# Patient Record
Sex: Male | Born: 1985 | Race: White | Hispanic: No | Marital: Single | Smoking: Never smoker
Health system: Southern US, Community
[De-identification: ages and names within clinical notes are randomized; demographics above are authoritative.]

## PROBLEM LIST (undated history)

## (undated) DIAGNOSIS — Z940 Kidney transplant status: Secondary | ICD-10-CM

## (undated) DIAGNOSIS — I10 Essential (primary) hypertension: Secondary | ICD-10-CM

## (undated) DIAGNOSIS — N19 Unspecified kidney failure: Secondary | ICD-10-CM

## (undated) HISTORY — PX: KIDNEY TRANSPLANT: SHX239

---

## 2015-10-31 ENCOUNTER — Emergency Department (HOSPITAL_BASED_OUTPATIENT_CLINIC_OR_DEPARTMENT_OTHER): Payer: Managed Care, Other (non HMO)

## 2015-10-31 ENCOUNTER — Encounter (HOSPITAL_BASED_OUTPATIENT_CLINIC_OR_DEPARTMENT_OTHER): Payer: Self-pay | Admitting: *Deleted

## 2015-10-31 ENCOUNTER — Emergency Department (HOSPITAL_BASED_OUTPATIENT_CLINIC_OR_DEPARTMENT_OTHER)
Admission: EM | Admit: 2015-10-31 | Discharge: 2015-11-01 | Disposition: A | Discharge status: Other Acute Inpatient Hospital | Payer: Managed Care, Other (non HMO) | Attending: Emergency Medicine | Admitting: Emergency Medicine

## 2015-10-31 DIAGNOSIS — Y658 Other specified misadventures during surgical and medical care: Secondary | ICD-10-CM | POA: Insufficient documentation

## 2015-10-31 DIAGNOSIS — R1031 Right lower quadrant pain: Secondary | ICD-10-CM | POA: Diagnosis not present

## 2015-10-31 DIAGNOSIS — R109 Unspecified abdominal pain: Secondary | ICD-10-CM | POA: Diagnosis present

## 2015-10-31 DIAGNOSIS — Z79899 Other long term (current) drug therapy: Secondary | ICD-10-CM | POA: Insufficient documentation

## 2015-10-31 DIAGNOSIS — R10A1 Flank pain, right side: Secondary | ICD-10-CM

## 2015-10-31 DIAGNOSIS — I10 Essential (primary) hypertension: Secondary | ICD-10-CM | POA: Insufficient documentation

## 2015-10-31 DIAGNOSIS — R10A0 Flank pain, unspecified side: Secondary | ICD-10-CM

## 2015-10-31 DIAGNOSIS — Z87448 Personal history of other diseases of urinary system: Secondary | ICD-10-CM | POA: Insufficient documentation

## 2015-10-31 DIAGNOSIS — T8611 Kidney transplant rejection: Secondary | ICD-10-CM

## 2015-10-31 DIAGNOSIS — R110 Nausea: Secondary | ICD-10-CM | POA: Insufficient documentation

## 2015-10-31 HISTORY — DX: Kidney transplant status: Z94.0

## 2015-10-31 HISTORY — DX: Unspecified kidney failure: N19

## 2015-10-31 HISTORY — DX: Essential (primary) hypertension: I10

## 2015-10-31 LAB — COMPREHENSIVE METABOLIC PANEL
ALT: 13 U/L — AB (ref 17–63)
ANION GAP: 8 (ref 5–15)
AST: 18 U/L (ref 15–41)
Albumin: 4.1 g/dL (ref 3.5–5.0)
Alkaline Phosphatase: 74 U/L (ref 38–126)
BUN: 34 mg/dL — ABNORMAL HIGH (ref 6–20)
CHLORIDE: 113 mmol/L — AB (ref 101–111)
CO2: 19 mmol/L — AB (ref 22–32)
CREATININE: 2.33 mg/dL — AB (ref 0.61–1.24)
Calcium: 9.2 mg/dL (ref 8.9–10.3)
GFR, EST AFRICAN AMERICAN: 42 mL/min — AB (ref >60)
GFR, EST NON AFRICAN AMERICAN: 37 mL/min — AB (ref >60)
Glucose, Bld: 111 mg/dL — ABNORMAL HIGH (ref 65–99)
POTASSIUM: 4.7 mmol/L (ref 3.5–5.1)
SODIUM: 140 mmol/L (ref 135–145)
Total Bilirubin: 0.5 mg/dL (ref 0.3–1.2)
Total Protein: 7.6 g/dL (ref 6.5–8.1)

## 2015-10-31 LAB — URINE MICROSCOPIC-ADD ON: WBC, UA: NONE SEEN WBC/hpf (ref 0–5)

## 2015-10-31 LAB — CBC WITH DIFFERENTIAL/PLATELET
Basophils Absolute: 0 10*3/uL (ref 0.0–0.1)
Basophils Relative: 0 %
Eosinophils Absolute: 0.2 10*3/uL (ref 0.0–0.7)
Eosinophils Relative: 1 %
HEMATOCRIT: 39.8 % (ref 39.0–52.0)
HEMOGLOBIN: 12.8 g/dL — AB (ref 13.0–17.0)
LYMPHS ABS: 1.2 10*3/uL (ref 0.7–4.0)
LYMPHS PCT: 9 %
MCH: 29 pg (ref 26.0–34.0)
MCHC: 32.2 g/dL (ref 30.0–36.0)
MCV: 90.2 fL (ref 78.0–100.0)
MONOS PCT: 6 %
Monocytes Absolute: 0.8 10*3/uL (ref 0.1–1.0)
NEUTROS ABS: 11.3 10*3/uL — AB (ref 1.7–7.7)
NEUTROS PCT: 84 %
Platelets: 246 10*3/uL (ref 150–400)
RBC: 4.41 MIL/uL (ref 4.22–5.81)
RDW: 13.1 % (ref 11.5–15.5)
WBC: 13.6 10*3/uL — AB (ref 4.0–10.5)

## 2015-10-31 LAB — URINALYSIS, ROUTINE W REFLEX MICROSCOPIC
Bilirubin Urine: NEGATIVE
GLUCOSE, UA: NEGATIVE mg/dL
KETONES UR: NEGATIVE mg/dL
Leukocytes, UA: NEGATIVE
Nitrite: NEGATIVE
PROTEIN: 30 mg/dL — AB
Specific Gravity, Urine: 1.015 (ref 1.005–1.030)
pH: 6 (ref 5.0–8.0)

## 2015-10-31 MED ORDER — SODIUM CHLORIDE 0.9 % IV BOLUS (SEPSIS)
1000.0000 mL | Freq: Once | INTRAVENOUS | Status: AC
  Administered 2015-10-31: 1000 mL via INTRAVENOUS

## 2015-10-31 MED ORDER — FENTANYL CITRATE (PF) 100 MCG/2ML IJ SOLN
100.0000 ug | Freq: Once | INTRAMUSCULAR | Status: AC
  Administered 2015-10-31: 100 ug via INTRAVENOUS
  Filled 2015-10-31: qty 2

## 2015-10-31 NOTE — ED Notes (Signed)
Pt c/o right flank pain x 1 day 

## 2015-10-31 NOTE — ED Notes (Signed)
Patient transported to CT 

## 2015-10-31 NOTE — ED Provider Notes (Addendum)
CSN: [id number]     Arrival date & time 10/31/15  2225 History  By signing my name below, I, Alex Randall, attest that this documentation has been prepared under the direction and in the presence of Alex Core, MD. Electronically Signed: Phillis Randall, ED Scribe. 10/31/2015. 10:45 PM.    Chief Complaint  Patient presents with  . Flank Pain   The history is provided by the patient. No language interpreter was used.  HPI Comments: Alex Randall is a 30 y.o. Male with a hx of renal failure, HTN, and kidney transplant who presents to the Emergency Department complaining of constant, gradually worsening right flank pain onset 3 days ago, worsening today. Pt reports associated nausea. He states that he is taking his daily medications as directed, and works outside so he may have been around sick contacts. He denies hx of complications with kidney transplant, fever, chills, vomiting, diarrhea, constipation, dysuria, hematuria, frequency, urgency, or rash.   Past Medical History  Diagnosis Date  . Kidney transplant recipient   . Renal failure   . Hypertension    Past Surgical History  Procedure Laterality Date  . Kidney transplant     History reviewed. No pertinent family history. Social History  Substance Use Topics  . Smoking status: Never Smoker   . Smokeless tobacco: None  . Alcohol Use: No    Review of Systems  Constitutional: Negative for fever and chills.  Gastrointestinal: Positive for nausea. Negative for vomiting, diarrhea and constipation.  Genitourinary: Positive for flank pain. Negative for dysuria, urgency, frequency and hematuria.  Skin: Negative for rash.  All other systems reviewed and are negative.  Allergies  Lisinopril  Home Medications   Prior to Admission medications   Medication Sig Start Date End Date Taking? Authorizing Provider  amLODipine (NORVASC) 5 MG tablet Take 5 mg by mouth daily.   Yes Historical Provider, MD  mycophenolate (MYFORTIC) 360  MG TBEC EC tablet Take 360 mg by mouth 2 (two) times daily.   Yes Historical Provider, MD  tacrolimus (PROGRAF) 5 MG capsule Take 5 mg by mouth 2 (two) times daily.   Yes Historical Provider, MD   BP 143/89 mmHg  Pulse 57  Temp(Src) 97.6 F (36.4 C) (Oral)  Resp 18  Ht 6' (1.829 m)  Wt 145 lb (65.772 kg)  BMI 19.66 kg/m2  SpO2 100% Physical Exam  Constitutional: He is oriented to person, place, and time. He appears well-developed and well-nourished.  HENT:  Head: Normocephalic and atraumatic.  Eyes: EOM are normal. Pupils are equal, round, and reactive to light.  Neck: Normal range of motion. Neck supple.  Cardiovascular: Normal rate, regular rhythm and normal heart sounds.  Exam reveals no gallop and no friction rub.   No murmur heard. Pulmonary/Chest: Effort normal and breath sounds normal. He has no wheezes.  Abdominal: Soft. There is no tenderness. There is no CVA tenderness.  RLQ fullness with no tenderness at the site  Musculoskeletal: Normal range of motion.  Neurological: He is alert and oriented to person, place, and time.  Skin: Skin is warm and dry. No rash noted.  Psychiatric: He has a normal mood and affect. His behavior is normal.  Nursing note and vitals reviewed.   ED Course  Procedures (including critical care time) Results for orders placed or performed during the hospital encounter of 10/31/15  Urinalysis, Routine w reflex microscopic (not at [facility])  Result Value Ref Range   Color, Urine YELLOW YELLOW   APPearance CLEAR CLEAR  Specific Gravity, Urine 1.015 1.005 - 1.030   pH 6.0 5.0 - 8.0   Glucose, UA NEGATIVE NEGATIVE mg/dL   Hgb urine dipstick SMALL (A) NEGATIVE   Bilirubin Urine NEGATIVE NEGATIVE   Ketones, ur NEGATIVE NEGATIVE mg/dL   Protein, ur 30 (A) NEGATIVE mg/dL   Nitrite NEGATIVE NEGATIVE   Leukocytes, UA NEGATIVE NEGATIVE  Comprehensive metabolic panel  Result Value Ref Range   Sodium 140 135 - 145 mmol/L   Potassium 4.7 3.5 - 5.1  mmol/L   Chloride 113 (H) 101 - 111 mmol/L   CO2 19 (L) 22 - 32 mmol/L   Glucose, Bld 111 (H) 65 - 99 mg/dL   BUN 34 (H) 6 - 20 mg/dL   Creatinine, Ser 1.61 (H) 0.61 - 1.24 mg/dL   Calcium 9.2 8.9 - 09.6 mg/dL   Total Protein 7.6 6.5 - 8.1 g/dL   Albumin 4.1 3.5 - 5.0 g/dL   AST 18 15 - 41 U/L   ALT 13 (L) 17 - 63 U/L   Alkaline Phosphatase 74 38 - 126 U/L   Total Bilirubin 0.5 0.3 - 1.2 mg/dL   GFR calc non Af Amer 37 (L) >60 mL/min   GFR calc Af Amer 42 (L) >60 mL/min   Anion gap 8 5 - 15  CBC with Differential  Result Value Ref Range   WBC 13.6 (H) 4.0 - 10.5 K/uL   RBC 4.41 4.22 - 5.81 MIL/uL   Hemoglobin 12.8 (L) 13.0 - 17.0 g/dL   HCT 04.5 40.9 - 81.1 %   MCV 90.2 78.0 - 100.0 fL   MCH 29.0 26.0 - 34.0 pg   MCHC 32.2 30.0 - 36.0 g/dL   RDW 91.4 78.2 - 95.6 %   Platelets 246 150 - 400 K/uL   Neutrophils Relative % 84 %   Neutro Abs 11.3 (H) 1.7 - 7.7 K/uL   Lymphocytes Relative 9 %   Lymphs Abs 1.2 0.7 - 4.0 K/uL   Monocytes Relative 6 %   Monocytes Absolute 0.8 0.1 - 1.0 K/uL   Eosinophils Relative 1 %   Eosinophils Absolute 0.2 0.0 - 0.7 K/uL   Basophils Relative 0 %   Basophils Absolute 0.0 0.0 - 0.1 K/uL  Urine microscopic-add on  Result Value Ref Range   Squamous Epithelial / LPF 0-5 (A) NONE SEEN   WBC, UA NONE SEEN 0 - 5 WBC/hpf   RBC / HPF 0-5 0 - 5 RBC/hpf   Bacteria, UA RARE (A) NONE SEEN   No results found.  10:44 PM-Discussed treatment plan which includes labs with pt at bedside and pt agreed to plan.   Labs Review Labs Reviewed  URINALYSIS, ROUTINE W REFLEX MICROSCOPIC (NOT AT [facility]) - Abnormal; Notable for the following:    Hgb urine dipstick SMALL (*)    Protein, ur 30 (*)    All other components within normal limits  COMPREHENSIVE METABOLIC PANEL - Abnormal; Notable for the following:    Chloride 113 (*)    CO2 19 (*)    Glucose, Bld 111 (*)    BUN 34 (*)    Creatinine, Ser 2.33 (*)    ALT 13 (*)    GFR calc non Af Amer 37 (*)    GFR  calc Af Amer 42 (*)    All other components within normal limits  CBC WITH DIFFERENTIAL/PLATELET - Abnormal; Notable for the following:    WBC 13.6 (*)    Hemoglobin 12.8 (*)    Neutro  Abs 11.3 (*)    All other components within normal limits  URINE MICROSCOPIC-ADD ON - Abnormal; Notable for the following:    Squamous Epithelial / LPF 0-5 (*)    Bacteria, UA RARE (*)    All other components within normal limits    Imaging Review No results found. I have personally reviewed and evaluated these images and lab results as part of my medical decision-making.   EKG Interpretation None      MDM   Final diagnoses:  Right flank pain    Patient presents with right flank pain. Been dull over the last 3 days. More severe now. Has nauseousness without fever. He has had a renal transplant. No dysuria. He has been taking his medications. Will get urinalysis and blood work. Overall benign exam. Care turned over to Dr Preston Fleeting I personally performed the services described in this documentation, which was scribed in my presence. The recorded information has been reviewed and is accurate.      Alex Core, MD 10/31/15 2254 Patient has had worsened kidney function. Says baseline creatinine is around 1.5 and is now 2.3. He now states he has been off his antirejection medications for a while now. Will get CT scan to evaluate the right flank pain. Patient is managed at [facility].   Alex Core, MD 10/31/15 [phone]

## 2015-11-01 DIAGNOSIS — R110 Nausea: Secondary | ICD-10-CM | POA: Diagnosis not present

## 2015-11-01 DIAGNOSIS — Z87448 Personal history of other diseases of urinary system: Secondary | ICD-10-CM | POA: Diagnosis not present

## 2015-11-01 DIAGNOSIS — Z79899 Other long term (current) drug therapy: Secondary | ICD-10-CM | POA: Diagnosis not present

## 2015-11-01 DIAGNOSIS — R109 Unspecified abdominal pain: Secondary | ICD-10-CM | POA: Diagnosis present

## 2015-11-01 DIAGNOSIS — T8611 Kidney transplant rejection: Secondary | ICD-10-CM | POA: Diagnosis not present

## 2015-11-01 DIAGNOSIS — R1031 Right lower quadrant pain: Secondary | ICD-10-CM | POA: Diagnosis not present

## 2015-11-01 DIAGNOSIS — Y658 Other specified misadventures during surgical and medical care: Secondary | ICD-10-CM | POA: Diagnosis not present

## 2015-11-01 DIAGNOSIS — I10 Essential (primary) hypertension: Secondary | ICD-10-CM | POA: Diagnosis not present

## 2015-11-01 MED ORDER — ONDANSETRON HCL 4 MG/2ML IJ SOLN
4.0000 mg | Freq: Once | INTRAMUSCULAR | Status: AC
  Administered 2015-11-01: 4 mg via INTRAVENOUS
  Filled 2015-11-01: qty 2

## 2015-11-01 MED ORDER — HYDROMORPHONE HCL 1 MG/ML IJ SOLN
1.0000 mg | Freq: Once | INTRAMUSCULAR | Status: AC
  Administered 2015-11-01: 1 mg via INTRAVENOUS
  Filled 2015-11-01: qty 1

## 2015-11-01 MED ORDER — DEXTROSE 5 % IV SOLN
1.0000 g | Freq: Once | INTRAVENOUS | Status: AC
  Administered 2015-11-01: 1 g via INTRAVENOUS
  Filled 2015-11-01: qty 10

## 2015-11-01 MED ORDER — MORPHINE SULFATE (PF) 4 MG/ML IV SOLN
4.0000 mg | Freq: Once | INTRAVENOUS | Status: AC
  Administered 2015-11-01: 4 mg via INTRAVENOUS
  Filled 2015-11-01: qty 1

## 2015-11-01 NOTE — ED Provider Notes (Signed)
Patient initially seen and evaluated by Dr. Gara Kroner transplant patient with flank pain and increasing creatinine. CT does show edematous of transplanted kidney worrisome for possible rejection. No evidence of urinary tract infection. He is cared for at [facility] Case is discussed with Dr. Marland Mcalpine of nephrology service who agrees to accept the patient in transfer. She requests he be given a dose of ceftriaxone prior to transfer and this is done.  Results for orders placed or performed during the hospital encounter of 10/31/15  Urinalysis, Routine w reflex microscopic (not at [facility])  Result Value Ref Range   Color, Urine YELLOW YELLOW   APPearance CLEAR CLEAR   Specific Gravity, Urine 1.015 1.005 - 1.030   pH 6.0 5.0 - 8.0   Glucose, UA NEGATIVE NEGATIVE mg/dL   Hgb urine dipstick SMALL (A) NEGATIVE   Bilirubin Urine NEGATIVE NEGATIVE   Ketones, ur NEGATIVE NEGATIVE mg/dL   Protein, ur 30 (A) NEGATIVE mg/dL   Nitrite NEGATIVE NEGATIVE   Leukocytes, UA NEGATIVE NEGATIVE  Comprehensive metabolic panel  Result Value Ref Range   Sodium 140 135 - 145 mmol/L   Potassium 4.7 3.5 - 5.1 mmol/L   Chloride 113 (H) 101 - 111 mmol/L   CO2 19 (L) 22 - 32 mmol/L   Glucose, Bld 111 (H) 65 - 99 mg/dL   BUN 34 (H) 6 - 20 mg/dL   Creatinine, Ser 1.61 (H) 0.61 - 1.24 mg/dL   Calcium 9.2 8.9 - 09.6 mg/dL   Total Protein 7.6 6.5 - 8.1 g/dL   Albumin 4.1 3.5 - 5.0 g/dL   AST 18 15 - 41 U/L   ALT 13 (L) 17 - 63 U/L   Alkaline Phosphatase 74 38 - 126 U/L   Total Bilirubin 0.5 0.3 - 1.2 mg/dL   GFR calc non Af Amer 37 (L) >60 mL/min   GFR calc Af Amer 42 (L) >60 mL/min   Anion gap 8 5 - 15  CBC with Differential  Result Value Ref Range   WBC 13.6 (H) 4.0 - 10.5 K/uL   RBC 4.41 4.22 - 5.81 MIL/uL   Hemoglobin 12.8 (L) 13.0 - 17.0 g/dL   HCT 04.5 40.9 - 81.1 %   MCV 90.2 78.0 - 100.0 fL   MCH 29.0 26.0 - 34.0 pg   MCHC 32.2 30.0 - 36.0 g/dL   RDW 91.4 78.2 - 95.6 %   Platelets 246  150 - 400 K/uL   Neutrophils Relative % 84 %   Neutro Abs 11.3 (H) 1.7 - 7.7 K/uL   Lymphocytes Relative 9 %   Lymphs Abs 1.2 0.7 - 4.0 K/uL   Monocytes Relative 6 %   Monocytes Absolute 0.8 0.1 - 1.0 K/uL   Eosinophils Relative 1 %   Eosinophils Absolute 0.2 0.0 - 0.7 K/uL   Basophils Relative 0 %   Basophils Absolute 0.0 0.0 - 0.1 K/uL  Urine microscopic-add on  Result Value Ref Range   Squamous Epithelial / LPF 0-5 (A) NONE SEEN   WBC, UA NONE SEEN 0 - 5 WBC/hpf   RBC / HPF 0-5 0 - 5 RBC/hpf   Bacteria, UA RARE (A) NONE SEEN   Ct Renal Stone Study  11/01/2015  CLINICAL DATA:  Constant gradually worsening RIGHT flank pain for 3 days and hematuria. History of polycystic kidney disease and renal transplant, hypertension and renal failure. Elevated creatinine. EXAM: CT ABDOMEN AND PELVIS WITHOUT CONTRAST TECHNIQUE: Multidetector CT imaging of the abdomen and pelvis was performed following  the standard protocol without IV contrast. COMPARISON:  None. FINDINGS: LUNG BASES: Included view of the lung bases are clear. The visualized heart and pericardium are unremarkable. KIDNEYS/BLADDER: Atrophic cystic native kidneys. RIGHT renal transplant with mild hydronephrosis, the transplant appears mildly enlarged and edematous. No urolithiasis. Urinary bladder is partially distended with mild wall thickening. SOLID ORGANS: The liver, spleen, gallbladder, and adrenal glands are unremarkable for this non-contrast examination. Fullness in the mid retroperitoneum, anterior to the aorta and, posterior to the pancreas. GASTROINTESTINAL TRACT: The stomach, small and large bowel are normal in course and caliber without inflammatory changes, the sensitivity may be decreased by lack of enteric contrast. Mild amount of retained large bowel stool. The appendix is not discretely identified, however there are no inflammatory changes in the right lower quadrant. PERITONEUM/RETROPERITONEUM: Aortoiliac vessels are normal in  course and caliber. No lymphadenopathy by CT size criteria. Prostate size is normal. No intraperitoneal free fluid nor free air. SOFT TISSUES/ OSSEOUS STRUCTURES: Nonsuspicious. 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 IMPRESSION: Atrophic cystic native kidneys. Enlarged edematous RIGHT pelvic renal transplant, mild hydronephrosis without urolithiasis. Mild bladder wall thickening can be seen with cystitis. Midline retroperitoneal fullness which could represent prominent vessel, distended duodenum. Given patient's history of transplant, lymphadenopathy is possible. Recommend follow-up. Electronically Signed   By: Awilda Metro M.D.   On: 11/01/2015 00:17   Images viewed by me.   Dione Booze, MD 11/01/15 [phone]

## 2017-03-26 IMAGING — CT CT RENAL STONE PROTOCOL
2 of 4 series · 15 of 46 positions shown, 17 images · non-contrast
Comparison: None.

CLINICAL DATA: Constant gradually worsening RIGHT flank pain for 3
days and hematuria. History of polycystic kidney disease and renal
transplant, hypertension and renal failure. Elevated creatinine.

EXAM:
CT ABDOMEN AND PELVIS WITHOUT CONTRAST
TECHNIQUE: Multidetector CT imaging of the abdomen and pelvis was performed
following the standard protocol without IV contrast.

[Series 2: axial st · axial · 0.72mm/px · z∈[+688,+1103]mm · 12 of 95 slices shown, 14 images]
[im 8/95  soft-tissue]
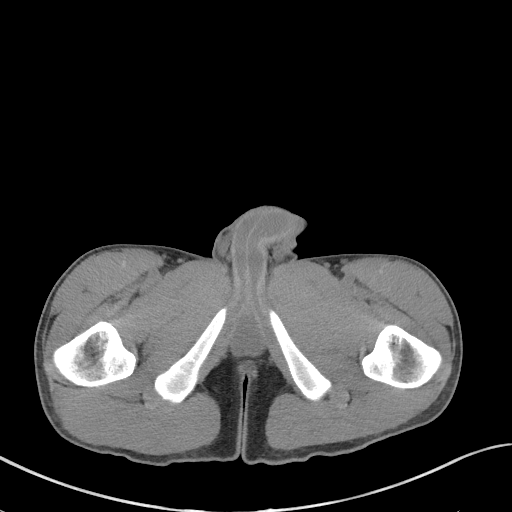
[im 8/95  bone]
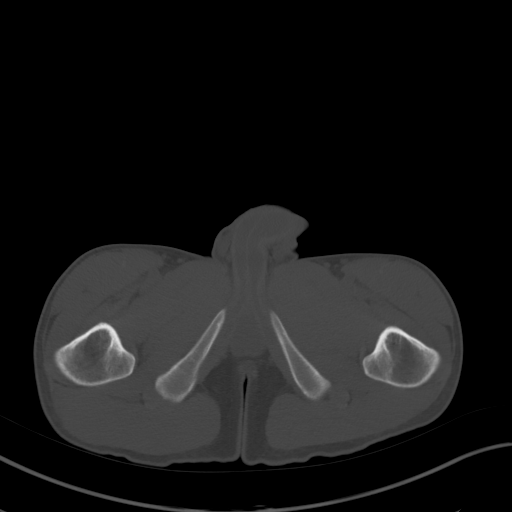
[im 16/95  soft-tissue]
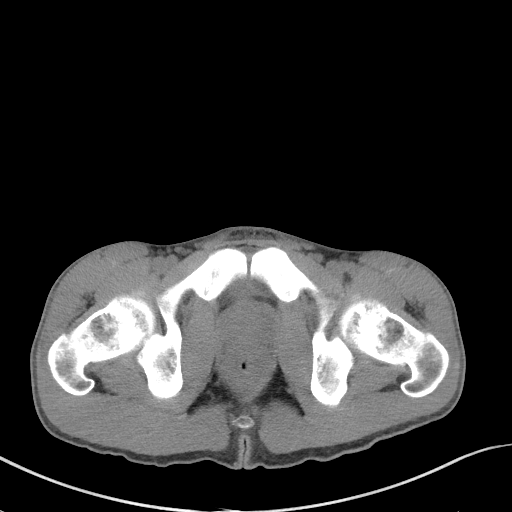
[im 23/95  soft-tissue]
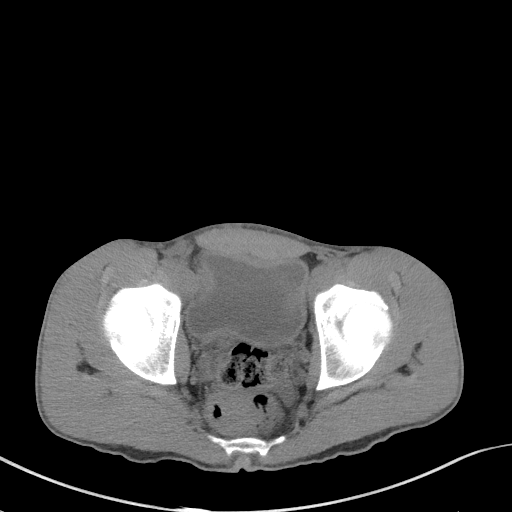
[im 31/95  soft-tissue]
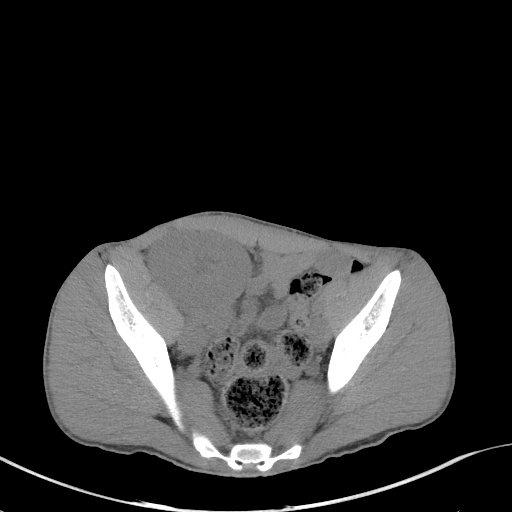
[im 38/95  soft-tissue]
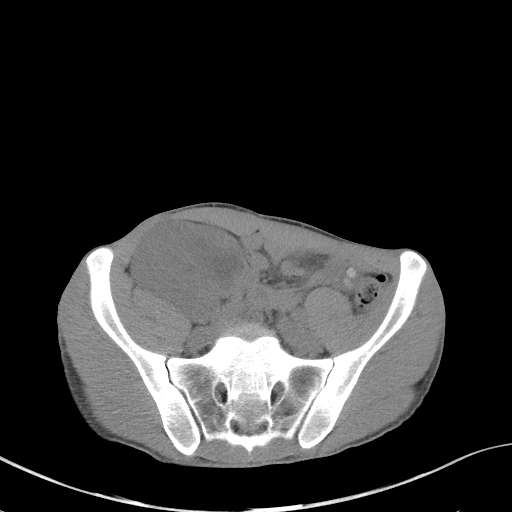
[im 46/95  soft-tissue]
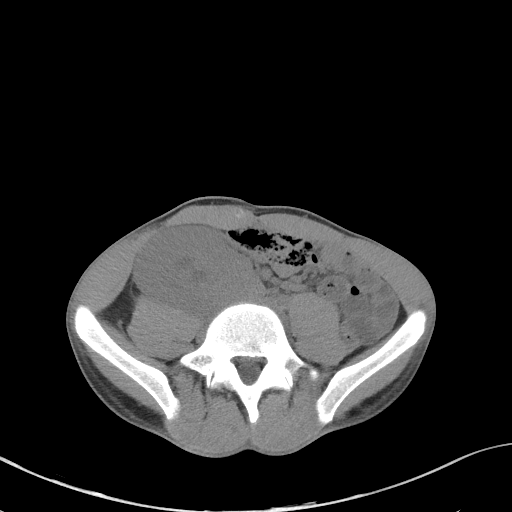
[im 53/95  soft-tissue]
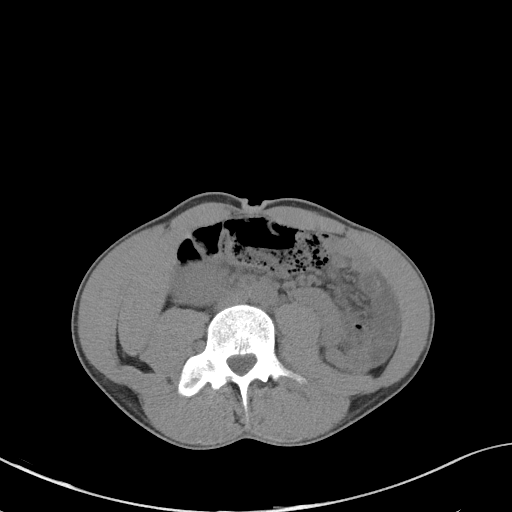
[im 61/95  soft-tissue]
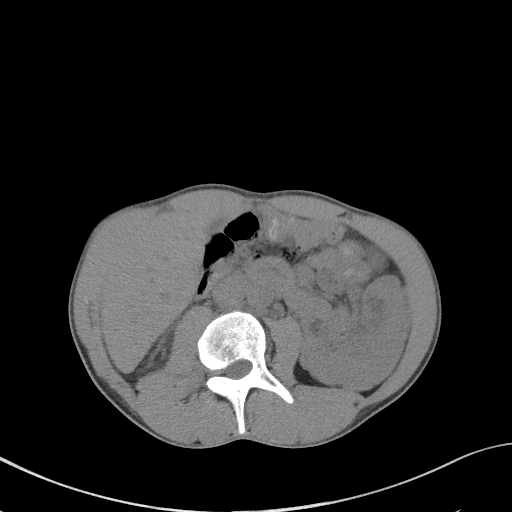
[im 68/95  soft-tissue]
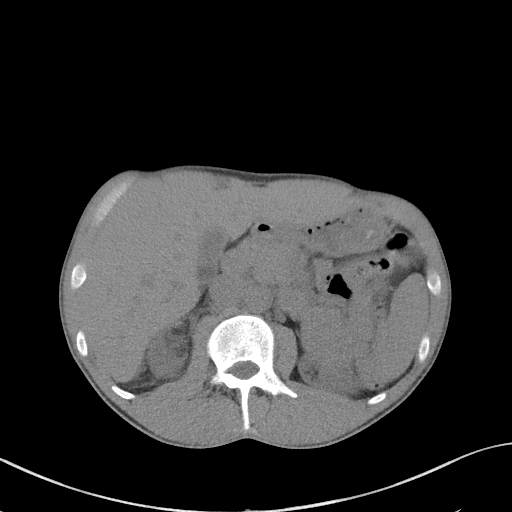
[im 68/95  bone]
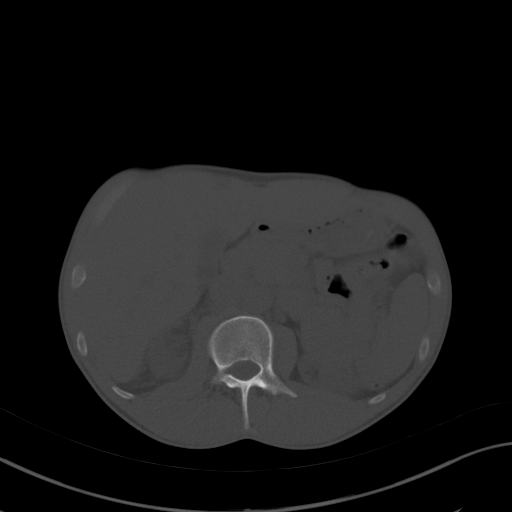
[im 76/95  soft-tissue]
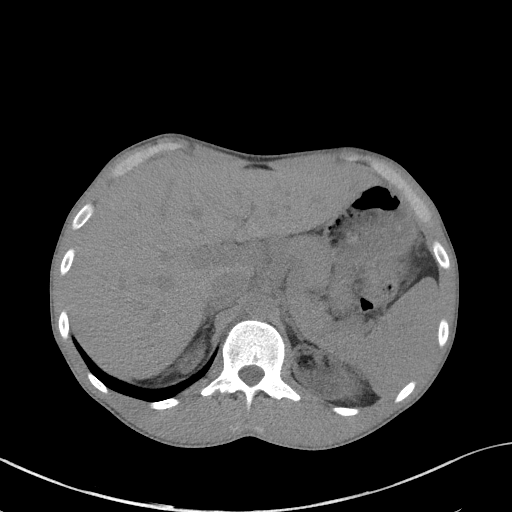
[im 83/95  soft-tissue]
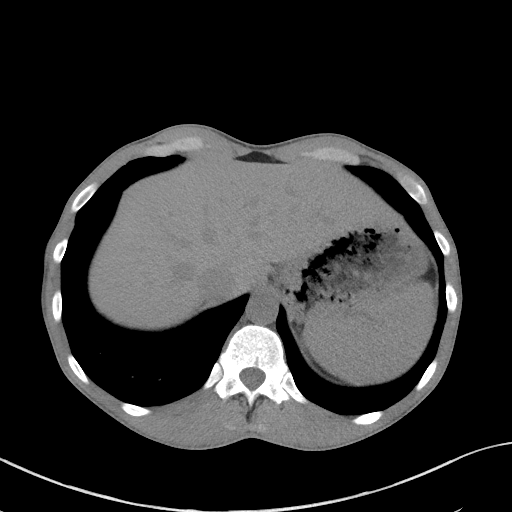
[im 91/95  soft-tissue]
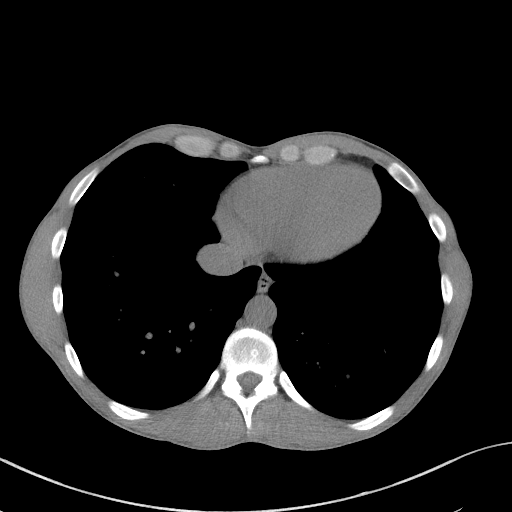

[Series 4: coronal st · coronal · 0.66mm/px · 3 of 80 slices shown]
[im 27/80  soft-tissue]
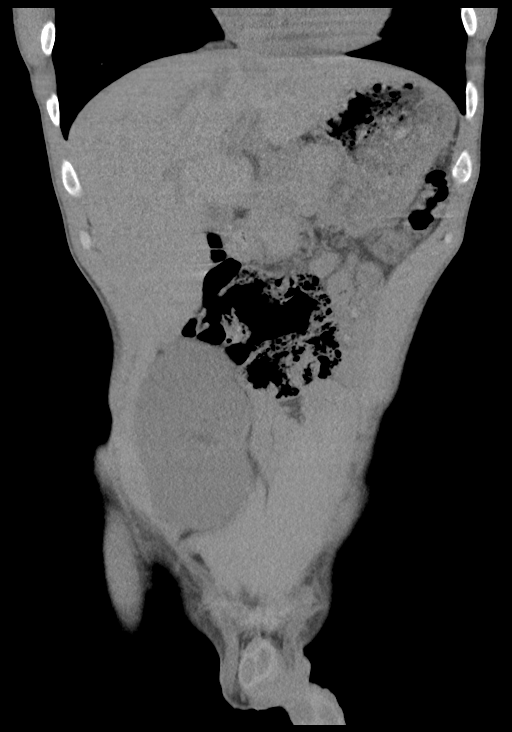
[im 36/80  soft-tissue]
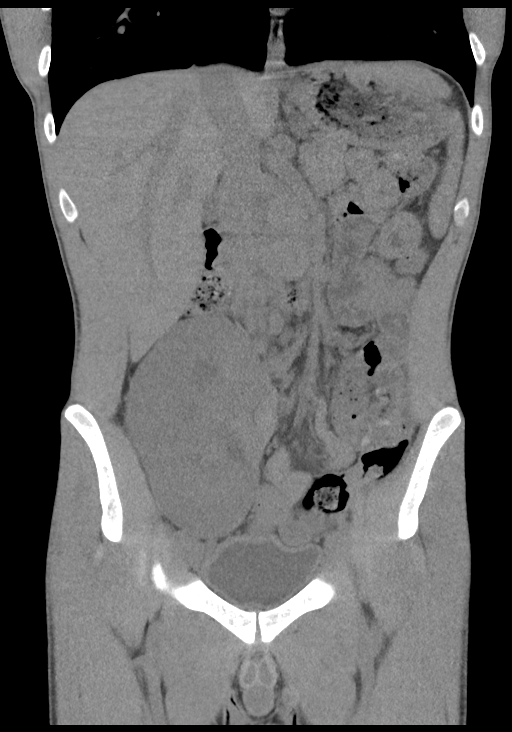
[im 44/80  soft-tissue]
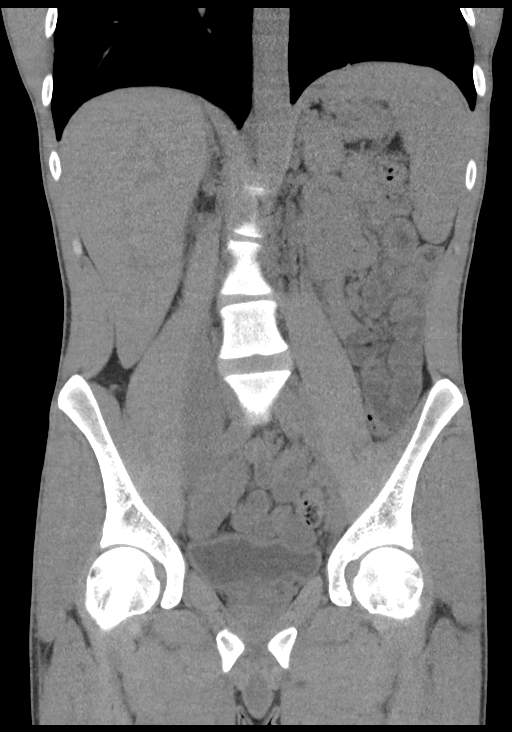

[15 of 46 positions shown; findings below may reference images not displayed]

FINDINGS: LUNG BASES: Included view of the lung bases are clear. The
visualized heart and pericardium are unremarkable.

KIDNEYS/BLADDER: Atrophic cystic native kidneys. RIGHT renal
transplant with mild hydronephrosis, the transplant appears mildly
enlarged and edematous. No urolithiasis. Urinary bladder is
partially distended with mild wall thickening.

SOLID ORGANS: The liver, spleen, gallbladder, and adrenal glands are
unremarkable for this non-contrast examination. Fullness in the mid
retroperitoneum, anterior to the aorta and, posterior to the
pancreas.

GASTROINTESTINAL TRACT: The stomach, small and large bowel are
normal in course and caliber without inflammatory changes, the
sensitivity may be decreased by lack of enteric contrast. Mild
amount of retained large bowel stool. The appendix is not discretely
identified, however there are no inflammatory changes in the right
lower quadrant.

PERITONEUM/RETROPERITONEUM: Aortoiliac vessels are normal in course
and caliber. No lymphadenopathy by CT size criteria. Prostate size
is normal. No intraperitoneal free fluid nor free air.

SOFT TISSUES/ OSSEOUS STRUCTURES: Nonsuspicious. L3 partial
butterfly vertebra with focal dextroscoliosis.
IMPRESSION: Atrophic cystic native kidneys. Enlarged edematous RIGHT pelvic
renal transplant, mild hydronephrosis without urolithiasis. Mild
bladder wall thickening can be seen with cystitis.

Midline retroperitoneal fullness which could represent prominent
vessel, distended duodenum. Given patient's history of transplant,
lymphadenopathy is possible. Recommend follow-up.

## 2022-07-02 ENCOUNTER — Other Ambulatory Visit: Payer: Self-pay

## 2022-07-02 ENCOUNTER — Encounter (HOSPITAL_COMMUNITY): Payer: Self-pay | Admitting: Emergency Medicine

## 2022-07-02 ENCOUNTER — Emergency Department (HOSPITAL_COMMUNITY)
Admission: EM | Admit: 2022-07-02 | Discharge: 2022-07-02 | Disposition: A | Discharge status: Home / Self Care | Payer: Worker's Compensation | Attending: Emergency Medicine | Admitting: Emergency Medicine

## 2022-07-02 DIAGNOSIS — S61411A Laceration without foreign body of right hand, initial encounter: Secondary | ICD-10-CM | POA: Diagnosis present

## 2022-07-02 DIAGNOSIS — Y990 Civilian activity done for income or pay: Secondary | ICD-10-CM | POA: Insufficient documentation

## 2022-07-02 DIAGNOSIS — W268XXA Contact with other sharp object(s), not elsewhere classified, initial encounter: Secondary | ICD-10-CM | POA: Diagnosis not present

## 2022-07-02 DIAGNOSIS — Z23 Encounter for immunization: Secondary | ICD-10-CM | POA: Insufficient documentation

## 2022-07-02 DIAGNOSIS — Z940 Kidney transplant status: Secondary | ICD-10-CM | POA: Diagnosis not present

## 2022-07-02 DIAGNOSIS — Z79899 Other long term (current) drug therapy: Secondary | ICD-10-CM | POA: Insufficient documentation

## 2022-07-02 DIAGNOSIS — I10 Essential (primary) hypertension: Secondary | ICD-10-CM | POA: Insufficient documentation

## 2022-07-02 MED ORDER — LIDOCAINE HCL (PF) 1 % IJ SOLN
30.0000 mL | Freq: Once | INTRAMUSCULAR | Status: AC
  Administered 2022-07-02: 30 mL via INTRADERMAL
  Filled 2022-07-02: qty 30

## 2022-07-02 MED ORDER — TETANUS-DIPHTH-ACELL PERTUSSIS 5-2.5-18.5 LF-MCG/0.5 IM SUSY
0.5000 mL | PREFILLED_SYRINGE | Freq: Once | INTRAMUSCULAR | Status: AC
  Administered 2022-07-02: 0.5 mL via INTRAMUSCULAR
  Filled 2022-07-02: qty 0.5

## 2022-07-02 NOTE — ED Notes (Signed)
Dr.Lockwood attempted to suture pt laceration to top of right hand. Uncontrolled bleeding per MD due to deep wound with possible tendon involvement. Pressure dressing placed to right hand at this time. Pending consult from hand surgery.

## 2022-07-02 NOTE — ED Provider Triage Note (Signed)
Emergency Medicine Provider Triage Evaluation Note  Alex Randall , a 36 y.o. male  was evaluated in triage.  Pt complains of laceration to the right hand about an hour ago while at work.  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  Accidentally cut his hand, came straight to the ED.  Does not believe his tetanus is up-to-date.  No other complaints at this time.  Still able to wiggle his fingers.  His hand was bandaged prior to the ED.  Review of Systems  Positive:  Negative: See above  Physical Exam  BP 119/77 (BP Location: Left Arm)   Pulse 88   Temp 97.6 F (36.4 C) (Oral)   Resp 16   SpO2 100%  Gen:   Awake, no distress   Resp:  Normal effort  MSK:   Moves extremities without difficulty Other:  Laceration with mild hemorrhage to the right posterior hand, longitudinal, angled towards the third digit.  Sensation and ROM appears grossly intact of right hand and associated digits.  Radial pulse 2+ bilaterally.  No obvious bone or deformity appreciated.  Medical Decision Making  Medically screening exam initiated at 11:36 AM.  Appropriate orders placed.  Daltyn Degroat was informed that the remainder of the evaluation will be completed by another provider, this initial triage assessment does not replace that evaluation, and the importance of remaining in the ED until their evaluation is complete.     Prince Rome, PA-C 51/88/41 [id number]

## 2022-07-02 NOTE — ED Provider Notes (Signed)
[facility] EMERGENCY DEPARTMENT Provider Note   CSN: [id number] Arrival date & time: 07/02/22  1048     History  Chief Complaint  Patient presents with   Laceration    Alex Randall is a 36 y.o. male.  HPI Previously well adult male presents with concern of hand laceration.  He was working, when he accidentally struck the dorsum of his right hand against the bleed.  After substantial bleeding was stopped with pressure application he presents for evaluation.  No inability to move the surrounding digits, no other injuries, no other concerns.    Home Medications Prior to Admission medications   Medication Sig Start Date End Date Taking? Authorizing Provider  amLODipine (NORVASC) 5 MG tablet Take 5 mg by mouth daily.    [provider]  mycophenolate (MYFORTIC) 360 MG TBEC EC tablet Take 360 mg by mouth 2 (two) times daily.    [provider]  tacrolimus (PROGRAF) 5 MG capsule Take 5 mg by mouth 2 (two) times daily.    [provider]      Allergies    Lisinopril    Review of Systems   Review of Systems  All other systems reviewed and are negative.   Physical Exam Updated Vital Signs BP 135/78 (BP Location: Left Arm)   Pulse 85   Temp 98 F (36.7 C) (Oral)   Resp 15   Ht 6' (1.829 m)   Wt 70.3 kg   SpO2 99%   BMI 21.02 kg/m  Physical Exam Vitals and nursing note reviewed.  Constitutional:      General: He is not in acute distress.    Appearance: He is well-developed.  HENT:     Head: Normocephalic and atraumatic.  Eyes:     Conjunctiva/sclera: Conjunctivae normal.  Cardiovascular:     Rate and Rhythm: Normal rate and regular rhythm.     Pulses: Normal pulses.  Pulmonary:     Effort: Pulmonary effort is normal. No respiratory distress.     Breath sounds: No stridor.  Abdominal:     General: There is no distension.  Musculoskeletal:     Comments: Laceration on dorsum of right hand as below.  Patient flexes and  extends all digits appropriately, however.  Wrist unremarkable.  Skin:    General: Skin is warm and dry.       Neurological:     Mental Status: He is alert and oriented to person, place, and time.     ED Results / Procedures / Treatments     Procedures Procedures    LACERATION REPAIR Performed by: Gerhard Munch Authorized by: Gerhard Munch Consent: Verbal consent obtained. Risks and benefits: risks, benefits and alternatives were discussed Consent given by: patient Patient identity confirmed: provided demographic data Prepped and Draped in normal sterile fashion Wound explored  Laceration Location: dorsum of R hand.  Extensor tendon visible with partial disruption  Two bleeding veins, one proximal and one distal - former controller w electric cautery, distal w alligator clamp  Laceration Length: 6cm  No Foreign Bodies seen or palpated  Anesthesia: local infiltration  Local anesthetic: lidocaine 1% no epinephrine  Anesthetic total: 6 ml  Irrigation method: syringe Amount of cleaning: standard  Skin closure: 4-0  Number of sutures: 6  Technique: Close after hemostasis achieved.  Patient tolerance: Patient tolerated the procedure well with no immediate complications.   Medications Ordered in ED Medications  Tdap (BOOSTRIX) injection 0.5 mL (0.5 mLs Intramuscular Given 07/02/22 1259)  lidocaine (PF) (XYLOCAINE) 1 % injection 30 mL (30 mLs Intradermal Given by Other 07/02/22 1351)    ED Course/ Medical Decision Making/ A&P This patient with a Hx of no medical problems presents to the ED for concern of wound to the dorsum of the right hand, this involves an extensive number of treatment options, and is a complaint that carries with it a high risk of complications and morbidity.    The differential diagnosis includes tendon disruption, vascular disruption   Social Determinants of Health:  No limiting factors  Consultations Obtained:  I requested  consultation with the hand surgery,  and discussed lab and imaging findings as well as pertinent plan - they recommend: Closure here, follow-up with hand surgery  Dispostion / Final MDM:  After consideration of the diagnostic results and the patient's response to treatment, he has tolerated wound closure without complication, after copious cleaning with iodine, saline rinse, discussion with our hand surgery colleagues, following discovery of venous disruption and tendon disruption.  Patient had tetanus update, as well.  Patient discharged in stable condition.  Final Clinical Impression(s) / ED Diagnoses Final diagnoses:  Laceration of right hand without foreign body, initial encounter     Carmin Muskrat, MD 07/02/22 [phone]

## 2022-07-02 NOTE — Discharge Instructions (Signed)
Please monitor your wound carefully and do not hesitate to return here.  Otherwise follow-up with our hand surgeon in about 1 week for appropriate follow-up.

## 2022-07-02 NOTE — ED Notes (Signed)
Nonadherent dressing applied to right hand and wrist brace applied. Education provided with verbalized understanding. Cleared for discharge.

## 2022-07-02 NOTE — Consult Note (Signed)
Reason for Consult:Right hand lac Referring Physician: Carmin Muskrat Time called: [id number] Time at bedside: Alex Randall is an 36 y.o. male.  HPI: Alex Randall was working today and went to catch some tape someone tossed to him not realizing there was a utility knife nearby. He cut the back of his right hand on it. He was brought to the ED and hand surgery was consulted. He works in [employer] and is R[employer].  Past Medical History:  Diagnosis Date   Hypertension    Kidney transplant recipient    Renal failure     Past Surgical History:  Procedure Laterality Date   KIDNEY TRANSPLANT      History reviewed. No pertinent family history.  Social History:  reports that he has never smoked. He does not have any smokeless tobacco history on file. He reports that he does not drink alcohol and does not use drugs.  Allergies:  Allergies  Allergen Reactions   Lisinopril     Medications: I have reviewed the patient's current medications.  No results found for this or any previous visit (from the past 48 hour(s)).  No results found.  Review of Systems  HENT:  Negative for ear discharge, ear pain, hearing loss and tinnitus.   Eyes:  Negative for photophobia and pain.  Respiratory:  Negative for cough and shortness of breath.   Cardiovascular:  Negative for chest pain.  Gastrointestinal:  Negative for abdominal pain, nausea and vomiting.  Genitourinary:  Negative for dysuria, flank pain, frequency and urgency.  Musculoskeletal:  Positive for arthralgias (Right hand). Negative for back pain, myalgias and neck pain.  Neurological:  Negative for dizziness and headaches.  Hematological:  Does not bruise/bleed easily.  Psychiatric/Behavioral:  The patient is not nervous/anxious.    Blood pressure 121/85, pulse 69, temperature 97.6 F (36.4 C), temperature source Oral, resp. rate 15, height 6' (1.829 m), weight 70.3 kg, SpO2 100 %. Physical Exam Constitutional:      General: He is not  in acute distress.    Appearance: He is well-developed. He is not diaphoretic.  HENT:     Head: Normocephalic and atraumatic.  Eyes:     General: No scleral icterus.       Right eye: No discharge.        Left eye: No discharge.     Conjunctiva/sclera: Conjunctivae normal.  Cardiovascular:     Rate and Rhythm: Normal rate and regular rhythm.  Pulmonary:     Effort: Pulmonary effort is normal. No respiratory distress.  Musculoskeletal:     Cervical back: Normal range of motion.     Comments: Right shoulder, elbow, wrist, digits- 4cm longitudinal laceration dorsum of hand between 3rd and 4th MC, long finger extensor tendon exposed and nicked <25% width, some small venous bleeders noted, mod TTP, sensation and extension intact to long/ring fingers, no instability, no blocks to motion  Sens  Ax/R/M/U intact  Mot   Ax/ R/ PIN/ M/ AIN/ U intact  Rad 2+  Skin:    General: Skin is warm and dry.  Neurological:     Mental Status: He is alert.  Psychiatric:        Mood and Affect: Mood normal.        Behavior: Behavior normal.     Assessment/Plan: Right hand lac -- should do well with I&D and closure by EDP. F/u with Dr. Jenne Pane next week.    Lisette Abu, PA-C Orthopedic Surgery [phone] 07/02/2022, 3:01 PM

## 2022-07-02 NOTE — ED Triage Notes (Signed)
Patient arrives ambulatory by POV states while working today cut back of his hand on metal blade. Patient has laceration to back of right hand. Actively bleeding. Pressure dressing applied.

## 2022-07-02 NOTE — ED Notes (Signed)
Right hand soaked in betadine at this time. Will continue to monitor.

## 2022-07-04 ENCOUNTER — Telehealth: Payer: Self-pay

## 2022-07-04 NOTE — Telephone Encounter (Signed)
Patient requesting a note for work. Discussed staying out a week with MD, MD messaged regarding this. He is to follow up with a hand specialist. Patient requesting note via email. Sent below to [email or web address] [facility] [place], [place]   07/04/2022  To whom it may concern,  This is to verify that Alex Randall was seen in the Emergency Room at [facility] on Friday July 02, 2022.   He should be excused from work until July 12, 2022. He is following up with a hand specialist, Dr Ala Bent and will make an appointment on July 06, 2022.  Should you have any questions, please feel free to call.  Dr. Alma Friendly DNP, MSN, RN  [place], [place] [phone]

## 2022-07-13 NOTE — ED Notes (Signed)
Patient called in stating that he needed a referral from the ER provider that saw him on 07/02/22 in order to schedule an appointment with the hand surgeon that he was referred to during his visit. Reached out to the provider for hand surgery to inquire what information was needed from this ER, providers office states that nothing is needed from the ER and they would reach out to the patient to assist with scheduling an appointment. Informed patient of information. Patient stated understanding.
